# Patient Record
Sex: Male | Born: 1945 | Race: White | Hispanic: No | Marital: Married | Smoking: Never smoker
Health system: Southern US, Community
[De-identification: ages and names within clinical notes are randomized; demographics above are authoritative.]

## PROBLEM LIST (undated history)

## (undated) DIAGNOSIS — E785 Hyperlipidemia, unspecified: Secondary | ICD-10-CM

## (undated) DIAGNOSIS — G4730 Sleep apnea, unspecified: Secondary | ICD-10-CM

## (undated) DIAGNOSIS — I10 Essential (primary) hypertension: Secondary | ICD-10-CM

## (undated) DIAGNOSIS — E669 Obesity, unspecified: Secondary | ICD-10-CM

## (undated) DIAGNOSIS — E119 Type 2 diabetes mellitus without complications: Secondary | ICD-10-CM

## (undated) HISTORY — PX: KNEE ARTHROSCOPY: SUR90

---

## 2011-10-30 ENCOUNTER — Inpatient Hospital Stay (HOSPITAL_COMMUNITY)
Admission: EM | Admit: 2011-10-30 | Discharge: 2011-10-31 | DRG: 287 | Disposition: A | Discharge status: Home / Self Care | Payer: 59 | Source: Ambulatory Visit | Attending: Cardiology | Admitting: Cardiology

## 2011-10-30 ENCOUNTER — Emergency Department (HOSPITAL_COMMUNITY): Payer: 59

## 2011-10-30 ENCOUNTER — Other Ambulatory Visit: Payer: Self-pay

## 2011-10-30 ENCOUNTER — Encounter: Payer: Self-pay | Admitting: Emergency Medicine

## 2011-10-30 DIAGNOSIS — E669 Obesity, unspecified: Secondary | ICD-10-CM | POA: Diagnosis present

## 2011-10-30 DIAGNOSIS — E119 Type 2 diabetes mellitus without complications: Secondary | ICD-10-CM | POA: Diagnosis present

## 2011-10-30 DIAGNOSIS — R0789 Other chest pain: Principal | ICD-10-CM | POA: Diagnosis present

## 2011-10-30 DIAGNOSIS — E785 Hyperlipidemia, unspecified: Secondary | ICD-10-CM | POA: Diagnosis present

## 2011-10-30 DIAGNOSIS — R079 Chest pain, unspecified: Secondary | ICD-10-CM

## 2011-10-30 DIAGNOSIS — G4730 Sleep apnea, unspecified: Secondary | ICD-10-CM | POA: Diagnosis present

## 2011-10-30 HISTORY — DX: Sleep apnea, unspecified: G47.30

## 2011-10-30 HISTORY — DX: Obesity, unspecified: E66.9

## 2011-10-30 HISTORY — DX: Hyperlipidemia, unspecified: E78.5

## 2011-10-30 LAB — CARDIAC PANEL(CRET KIN+CKTOT+MB+TROPI)
CK, MB: 3.8 ng/mL (ref 0.3–4.0)
CK, MB: 3.7 ng/mL (ref 0.3–4.0)
Relative Index: 2.8 — ABNORMAL HIGH (ref 0.0–2.5)
Total CK: 133 U/L (ref 7–232)
Troponin I: <0.3 ng/mL (ref <0.30)
Troponin I: <0.3 ng/mL (ref <0.30)

## 2011-10-30 LAB — DIFFERENTIAL
Basophils Relative: 0 % (ref 0–1)
Eosinophils Absolute: 0.2 10*3/uL (ref 0.0–0.7)
Lymphs Abs: 3.2 10*3/uL (ref 0.7–4.0)
Neutro Abs: 3.1 10*3/uL (ref 1.7–7.7)
Neutrophils Relative %: 45 % (ref 43–77)

## 2011-10-30 LAB — BASIC METABOLIC PANEL
GFR calc Af Amer: >90 mL/min (ref >90)
GFR calc non Af Amer: 88 mL/min — ABNORMAL LOW (ref >90)
Potassium: 4.2 mEq/L (ref 3.5–5.1)
Sodium: 138 mEq/L (ref 135–145)

## 2011-10-30 LAB — CBC
MCH: 31.7 pg (ref 26.0–34.0)
Platelets: 214 10*3/uL (ref 150–400)
RBC: 4.48 MIL/uL (ref 4.22–5.81)

## 2011-10-30 LAB — GLUCOSE, CAPILLARY: Glucose-Capillary: 142 mg/dL — ABNORMAL HIGH (ref 70–99)

## 2011-10-30 MED ORDER — HEPARIN SOD (PORCINE) IN D5W 100 UNIT/ML IV SOLN
1850.0000 [IU]/h | INTRAVENOUS | Status: DC
  Administered 2011-10-31: 1650 [IU]/h via INTRAVENOUS
  Filled 2011-10-30 (×3): qty 250

## 2011-10-30 MED ORDER — SODIUM CHLORIDE 0.9 % IJ SOLN
3.0000 mL | Freq: Two times a day (BID) | INTRAMUSCULAR | Status: DC
  Administered 2011-10-30: 3 mL via INTRAVENOUS

## 2011-10-30 MED ORDER — ASPIRIN EC 81 MG PO TBEC
81.0000 mg | DELAYED_RELEASE_TABLET | Freq: Every day | ORAL | Status: DC
  Administered 2011-10-31: 81 mg via ORAL
  Filled 2011-10-30: qty 1

## 2011-10-30 MED ORDER — ROSUVASTATIN CALCIUM 40 MG PO TABS
40.0000 mg | ORAL_TABLET | Freq: Every day | ORAL | Status: DC
  Filled 2011-10-30 (×2): qty 1

## 2011-10-30 MED ORDER — ONDANSETRON HCL 4 MG/2ML IJ SOLN: 4.0000 mg | Freq: Four times a day (QID) | INTRAMUSCULAR | Status: DC | PRN

## 2011-10-30 MED ORDER — SODIUM CHLORIDE 0.9 % IJ SOLN: 3.0000 mL | INTRAMUSCULAR | Status: DC | PRN

## 2011-10-30 MED ORDER — DIAZEPAM 5 MG PO TABS: 5.0000 mg | ORAL_TABLET | ORAL | Status: DC

## 2011-10-30 MED ORDER — SODIUM CHLORIDE 0.9 % IV SOLN
INTRAVENOUS | Status: DC
  Administered 2011-10-31: 125 mL/h via INTRAVENOUS

## 2011-10-30 MED ORDER — CITALOPRAM HYDROBROMIDE 40 MG PO TABS
40.0000 mg | ORAL_TABLET | Freq: Every day | ORAL | Status: DC
  Administered 2011-10-31: 40 mg via ORAL
  Filled 2011-10-30 (×2): qty 1

## 2011-10-30 MED ORDER — LISINOPRIL 10 MG PO TABS
10.0000 mg | ORAL_TABLET | Freq: Every day | ORAL | Status: DC
  Administered 2011-10-31: 10 mg via ORAL
  Filled 2011-10-30 (×2): qty 1

## 2011-10-30 MED ORDER — SODIUM CHLORIDE 0.9 % IV SOLN: 250.0000 mL | INTRAVENOUS | Status: DC | PRN

## 2011-10-30 MED ORDER — INSULIN ASPART 100 UNIT/ML ~~LOC~~ SOLN
0.0000 [IU] | Freq: Three times a day (TID) | SUBCUTANEOUS | Status: DC
  Administered 2011-10-31: 2 [IU] via SUBCUTANEOUS
  Filled 2011-10-30: qty 3

## 2011-10-30 MED ORDER — HEPARIN BOLUS VIA INFUSION
4000.0000 [IU] | Freq: Once | INTRAVENOUS | Status: AC
  Administered 2011-10-31: 4000 [IU] via INTRAVENOUS
  Filled 2011-10-30: qty 4000

## 2011-10-30 MED ORDER — ACETAMINOPHEN 325 MG PO TABS: 650.0000 mg | ORAL_TABLET | ORAL | Status: DC | PRN

## 2011-10-30 MED ORDER — NITROGLYCERIN 0.4 MG SL SUBL: 0.4000 mg | SUBLINGUAL_TABLET | SUBLINGUAL | Status: DC | PRN

## 2011-10-30 MED ORDER — INSULIN GLARGINE 100 UNIT/ML ~~LOC~~ SOLN
15.0000 [IU] | Freq: Every day | SUBCUTANEOUS | Status: DC
  Administered 2011-10-30: 15 [IU] via SUBCUTANEOUS
  Filled 2011-10-30: qty 3

## 2011-10-30 NOTE — Progress Notes (Signed)
ANTICOAGULATION CONSULT NOTE - Initial Consult  Pharmacy Consult for heparin Indication: chest pain/ACS  No Known Allergies  Patient Measurements:   Adjusted Body Weight: 116 kg  Vital Signs: Temp: 98.2 F (36.8 C) (12/03 1648) Temp src: Oral (12/03 1648) BP: 104/56 mmHg (12/03 1829) Pulse Rate: 48  (12/03 1829)  Labs:  Basename 10/30/11 1820 10/30/11 1816  HGB -- 14.2  HCT -- 39.2  PLT -- 214  APTT -- --  LABPROT -- --  INR -- --  HEPARINUNFRC -- --  CREATININE -- 0.92  CKTOTAL 139 --  CKMB 3.8 --  TROPONINI <0.30 --  Medical History: Past Medical History  Diagnosis Date  . Diabetes mellitus     type 2, insulin dependent, uncontrolled  . Hyperlipidemia   . Obesity   . Sleep apnea     On CPAP    Medications:  Prescriptions prior to admission  Medication Sig Dispense Refill  . atorvastatin (LIPITOR) 80 MG tablet Take 80 mg by mouth daily.        . celecoxib (CELEBREX) 200 MG capsule Take 200 mg by mouth daily.        . citalopram (CELEXA) 40 MG tablet Take 40 mg by mouth daily.        . insulin glargine (LANTUS) 100 UNIT/ML injection Inject 15 Units into the skin at bedtime.        Marland Kitchen lisinopril (PRINIVIL,ZESTRIL) 10 MG tablet Take 10 mg by mouth daily.          Assessment: 65 yo man to start heparin for ACS.  Pt w/ hx noncompliance. His meds were 30 day supplies that were filled months ago and his pharmacy does not think he is taking them as he is supposed to.  Goal of Therapy:  Heparin level 0.3-0.7 units/ml   Plan:  Heparin 4000 units bolus, heparin drip 1650 units/hr. 6 hour heparin level. Daily HL and CBC while on heparin.  Len Childs T 10/30/2011,9:37 PM

## 2011-10-30 NOTE — ED Notes (Signed)
Checked cbg it was 42 notified RN Bonita Quin of blood sugar

## 2011-10-30 NOTE — ED Notes (Signed)
Per EMS pt was at work doing physical work/lifting began having dull left sided cp with SOB . Pt went to urgent care and was given NTG with cp and SOB subsiding. EKG unremarkable. Pain free now. Pt's father and mother both with cardiac history.

## 2011-10-30 NOTE — H&P (Signed)
History reviewed with patient, no changes to be made..  Patient exam reveals large gentleman in no acute distress.  All available labs, radiology testing, previous records reviewed.  EKG shows sinus bradycardia but no acute changes. History is concerning for new onset angina.  Risk factors of sedentary lifestyle, diabetes, high cholesterol and father having MIs in his late fifties or early sixties.  Plan will be for cardiac cath in am.    10/30/2011, 9:57 PM, Cassell Clement

## 2011-10-30 NOTE — ED Provider Notes (Signed)
History     CSN: [id number] Arrival date & time: 10/30/2011  4:36 PM   First MD Initiated Contact with Patient 10/30/11 1637      Chief Complaint  Patient presents with  . Chest Pain     HPI  65 year old male history of hypertension, insulin-dependent diabetes, hyperlipidemia presents with chest pain. The patient states that he was doing physical lifting when he began having dull left-sided chest pain. The pain is without radiation. He was seen in urgent care and given 4 baby aspirin and nitroglycerin which resolved his pain. He denies back pain, Donald pain. He denies shortness of breath, weakness. He states that when he had the chest pain which lasted for approximately 20 minutes he felt like he was a fall. Denies h/o VTE in self or family. No recent hosp/surg/immob. No h/o cancer. Denies exogenous hormone use, no leg pain or swelling. Family history of coronary artery disease his father had several MIs his first being his 72s. Patient is nonsmoker. Is currently chest pain free. Denies fever, chills, cough. No History of similar. Pain is not worse with movement. No history of stress test.     Vela Prose 10/30/2011 16:43     Per EMS pt was at work doing physical work/lifting began having dull left sided cp with SOB . Pt went to urgent care and was given NTG with cp and SOB subsiding. EKG unremarkable. Pain free now. Pt's father and mother both with cardiac history.     Past Medical History  Diagnosis Date  . Diabetes mellitus     type 2, insulin dependent, uncontrolled  . Hyperlipidemia   . Obesity   . Sleep apnea     On CPAP    No past surgical history on file.  No family history on file.  History  Substance Use Topics  . Smoking status: Not on file  . Smokeless tobacco: Not on file  . Alcohol Use:     Review of Systems  All other systems reviewed and are negative.   except as noted HPI    Allergies  Review of patient's allergies indicates no known  allergies.  Home Medications   Current Outpatient Rx  Name Route Sig Dispense Refill  . ATORVASTATIN CALCIUM 80 MG PO TABS Oral Take 80 mg by mouth daily.      . CELECOXIB 200 MG PO CAPS Oral Take 200 mg by mouth daily.      Marland Kitchen CITALOPRAM HYDROBROMIDE 40 MG PO TABS Oral Take 40 mg by mouth daily.      . INSULIN GLARGINE 100 UNIT/ML  SOLN Subcutaneous Inject 15 Units into the skin at bedtime.      Marland Kitchen LISINOPRIL 10 MG PO TABS Oral Take 10 mg by mouth daily.        BP 104/56  Pulse 48  Temp(Src) 98.2 F (36.8 C) (Oral)  Resp 17  SpO2 97%  Physical Exam  Nursing note and vitals reviewed. Constitutional: He is oriented to person, place, and time. He appears well-developed and well-nourished. No distress.  HENT:  Head: Atraumatic.  Mouth/Throat: Oropharynx is clear and moist.  Eyes: Conjunctivae are normal. Pupils are equal, round, and reactive to light.  Neck: Neck supple.  Cardiovascular: Normal rate, regular rhythm, normal heart sounds and intact distal pulses.  Exam reveals no gallop and no friction rub.   No murmur heard. Pulmonary/Chest: Effort normal. No respiratory distress. He has no wheezes. He has no rales. He exhibits no tenderness.  Abdominal: Soft.  Bowel sounds are normal. There is no tenderness. There is no rebound and no guarding.  Musculoskeletal: Normal range of motion. He exhibits no edema and no tenderness.  Neurological: He is alert and oriented to person, place, and time.  Skin: Skin is warm and dry.  Psychiatric: He has a normal mood and affect.     Date: 10/30/2011  Rate: 53  Rhythm: sinus bradycardia  QRS Axis: normal  Intervals: PR prolonged  ST/T Wave abnormalities: normal  Conduction Disutrbances:first-degree A-V block   Narrative Interpretation:   Old EKG Reviewed: unchanged   ED Course  Procedures (including critical care time)  Labs Reviewed  CBC - Abnormal; Notable for the following:    MCHC 36.2 (*)    All other components within  normal limits  BASIC METABOLIC PANEL - Abnormal; Notable for the following:    Glucose, Bld 186 (*)    GFR calc non Af Amer 88 (*)    All other components within normal limits  CARDIAC PANEL(CRET KIN+CKTOT+MB+TROPI) - Abnormal; Notable for the following:    Relative Index 2.7 (*)    All other components within normal limits  GLUCOSE, CAPILLARY - Abnormal; Notable for the following:    Glucose-Capillary 204 (*)    All other components within normal limits  DIFFERENTIAL   Dg Chest 2 View  10/30/2011  *RADIOLOGY REPORT*  Clinical Data: Chest pain.  CHEST - 2 VIEW  Comparison: None.  Findings: Normal sized heart.  Clear lungs.  Mildly tortuous aorta. Thoracic spine degenerative changes.  IMPRESSION: No acute abnormality.  Original Report Authenticated By: Darrol Angel, M.D.     1. Chest pain      MDM  Multiple rf CAD pw chest pain x 20 minutes which has resolved. EKG unremarkable. CP free at this time. Received asa and ntg pta. CXR, labs, reassess.   [facility] has seen the patient. Admitted to Dr. Earl Gala, MD 10/30/11 2044

## 2011-10-30 NOTE — H&P (Signed)
History and Physical  Patient ID: Lance Barnes MRN: [id number], SOB: 10-Jan-1946 66 y.o. Date of Encounter: 10/30/2011, 8:15 PM  Primary Physician: No primary provider on file. Primary Cardiologist: New to [place], Admitted by Dr. Patty Sermons  Chief Complaint: Chest Pain Reason for Admission: Chest pain  HPI: 65yom w/ PMHx Insulin-dependent DM, HLD, Obesity and NO known h/o CAD who presented to [facility] ED with chest pain. He was in his usual state of health while at work today around 3pm he was walking and noticed his legs felt heavy, then he felt a dull pain in his left chest and felt like he was in a daze. The pain was associated with palpitations, but NO shortness of breath, dizziness, nausea, diaphoresis or radiation of pain. He has never experienced this pain in the past.   He presented to Urgent Care where he received 4 baby ASA and 1 sublingual Nitro. After 2-75minutes he was chest pain free. The chest pain lasted ~30-45 minutes total. He was then transferred to [facility] ED where has remained chest pain free since. His EKG showed sinus bradycardia with 1st degree AV block, 53bpm, NO acute ST/T wave changes. First troponin negative and CXR without acute cardiopulmonary findings.   Past Medical History  Diagnosis Date  . Obesity   . Diabetes mellitus Type 2- Uncontrolled, insulin dependent   . Hyperlipidemia    Sleep Apnea - on CPAP      Surgical History: Bilateral knee surgery   Home Meds: Medication Sig  atorvastatin (LIPITOR) 80 MG tablet Take 80 mg by mouth daily.    celecoxib (CELEBREX) 200 MG capsule Take 200 mg by mouth daily.    citalopram (CELEXA) 40 MG tablet Take 40 mg by mouth daily.    insulin glargine (LANTUS) 100 UNIT/ML injection Inject 15 Units into the skin at bedtime.    lisinopril (PRINIVIL,ZESTRIL) 10 MG tablet Take 10 mg by mouth daily.      Allergies: No Known Allergies  History   Social History  . Marital Status: Married   Occupational History  . Works for a  [employer]   Social History Main Topics  . Smoking status: Quit 17yrs ago; 10pack year history  . Smokeless tobacco: Not on file  . Alcohol Use: Occasional beer  . Drug Use: Denies  . Sexually Active:    Family History: Father - MIs in his 61s  Review of Systems: General: negative for chills, fever, night sweats or weight changes.  Cardiovascular:  As per HPI; negative for dyspnea on exertion, edema, paroxysmal nocturnal dyspnea or shortness of breath Dermatological: negative for rash Respiratory: negative for cough or wheezing Urologic: negative for hematuria Abdominal: negative for nausea, vomiting, diarrhea, bright red blood per rectum, melena, or hematemesis Neurologic: negative for visual changes, syncope, or dizziness All other systems reviewed and are otherwise negative except as noted above.  Labs:   Lab Results  Component Value Date   WBC 7.0 10/30/2011   HGB 14.2 10/30/2011   HCT 39.2 10/30/2011   MCV 87.5 10/30/2011   PLT 214 10/30/2011    Lab 10/30/11 1816  NA 138  K 4.2  CL 103  CO2 26  BUN 12  CREATININE 0.92  CALCIUM 9.1  GLUCOSE 186*    Basename 10/30/11 1820  CKTOTAL 139  CKMB 3.8  TROPONINI <0.30    Radiology/Studies:  Dg Chest 2 View 10/30/2011    Findings: Normal sized heart.  Clear lungs.  Mildly tortuous aorta. Thoracic spine degenerative changes.  IMPRESSION: No acute abnormality.      EKG: Sinus bradycardia, 59bm, w/ 1st degree AV block, NO ST/T wave changes  Physical Exam Blood pressure 104/56, pulse 48, temperature 98.2 F (36.8 C), temperature source Oral, resp. rate 17, SpO2 97.00%. General: Obese, white male. Well developed, well nourished, in no acute distress. Head: Normocephalic, atraumatic, sclera non-icteric, nares are without discharge. Neck: Supple, Negative for carotid bruits. JVD not elevated. Lungs: Clear bilaterally to auscultation without wheezes, rales, or rhonchi. Breathing is unlabored. Heart:  Distant heart sounds; RRR with S1 S2. No murmurs, rubs, or gallops appreciated. Abdomen: Obese, Soft, non-tender, non-distended with normoactive bowel sounds. No rebound/guarding. No obvious abdominal masses. Msk:  Strength and tone appear normal for age. Extremities: No clubbing or cyanosis. No edema.  Distal pedal pulses are 2+ and equal bilaterally. Neuro: Alert and oriented X 3. Moves all extremities spontaneously. Psych:  Responds to questions appropriately with a normal affect.     ASSESSMENT AND PLAN:  65yom w/ PMHx Insulin-dependent DM, HLD, Obesity and NO known h/o CAD who presented to [facility] ED with chest pain. His initial EKG and troponin are without acute ischemic findings, but due to his significant risk factors he will be admitted for further evaluation and treatment. Given his risk factors and probability of getting poor nuclear images due to his body habitus, he will go for heart catheterization in the morning.  1. Chest pain: Plan for heart catheterization in the am. NPO after midnight. Cycle cardiac enzymes. Add ASA, Heparin drip and PRN nitro. Continue statin. Will hold off on starting beta blocker 2/2 bradycardia. 2. HLD: Lipid panel pending. Cont statin 3. Sleep apnea: Respiratory therapy to fit him for CPAP for tonight until his wife can bring his home CPAP 4. Obesity: Recommend weight loss  Signed, Rimas Gilham PA-C 10/30/2011, 8:15 PM

## 2011-10-31 ENCOUNTER — Other Ambulatory Visit: Payer: Self-pay

## 2011-10-31 ENCOUNTER — Encounter (HOSPITAL_COMMUNITY)
Admission: EM | Disposition: A | Discharge status: Home / Self Care | Payer: Self-pay | Source: Ambulatory Visit | Attending: Cardiology

## 2011-10-31 ENCOUNTER — Encounter (HOSPITAL_COMMUNITY): Payer: Self-pay | Admitting: *Deleted

## 2011-10-31 DIAGNOSIS — R079 Chest pain, unspecified: Secondary | ICD-10-CM

## 2011-10-31 HISTORY — PX: LEFT HEART CATHETERIZATION WITH CORONARY ANGIOGRAM: SHX5451

## 2011-10-31 LAB — BASIC METABOLIC PANEL
BUN: 12 mg/dL (ref 6–23)
CO2: 28 mEq/L (ref 19–32)
Chloride: 105 mEq/L (ref 96–112)
Creatinine, Ser: 0.96 mg/dL (ref 0.50–1.35)

## 2011-10-31 LAB — CBC
Hemoglobin: 14.1 g/dL (ref 13.0–17.0)
MCH: 30.9 pg (ref 26.0–34.0)
MCV: 88.8 fL (ref 78.0–100.0)
RBC: 4.56 MIL/uL (ref 4.22–5.81)

## 2011-10-31 LAB — POCT ACTIVATED CLOTTING TIME: Activated Clotting Time: 105 seconds

## 2011-10-31 LAB — GLUCOSE, CAPILLARY: Glucose-Capillary: 142 mg/dL — ABNORMAL HIGH (ref 70–99)

## 2011-10-31 LAB — LIPID PANEL
HDL: 42 mg/dL (ref >39)
Total CHOL/HDL Ratio: 3.2 RATIO

## 2011-10-31 LAB — HEPARIN LEVEL (UNFRACTIONATED): Heparin Unfractionated: 0.31 IU/mL (ref 0.30–0.70)

## 2011-10-31 LAB — CARDIAC PANEL(CRET KIN+CKTOT+MB+TROPI): Total CK: 104 U/L (ref 7–232)

## 2011-10-31 SURGERY — LEFT HEART CATHETERIZATION WITH CORONARY ANGIOGRAM
Anesthesia: LOCAL

## 2011-10-31 MED ORDER — ACETAMINOPHEN 325 MG PO TABS: 650.0000 mg | ORAL_TABLET | ORAL | Status: DC | PRN

## 2011-10-31 MED ORDER — LIDOCAINE HCL (PF) 1 % IJ SOLN
INTRAMUSCULAR | Status: AC
  Filled 2011-10-31: qty 30

## 2011-10-31 MED ORDER — FENTANYL CITRATE 0.05 MG/ML IJ SOLN
INTRAMUSCULAR | Status: AC
  Filled 2011-10-31: qty 2

## 2011-10-31 MED ORDER — SODIUM CHLORIDE 0.9 % IV SOLN: INTRAVENOUS | Status: AC

## 2011-10-31 MED ORDER — NITROGLYCERIN 0.2 MG/ML ON CALL CATH LAB
INTRAVENOUS | Status: AC
  Filled 2011-10-31: qty 1

## 2011-10-31 MED ORDER — HEPARIN (PORCINE) IN NACL 2-0.9 UNIT/ML-% IJ SOLN
INTRAMUSCULAR | Status: AC
  Filled 2011-10-31: qty 2000

## 2011-10-31 MED ORDER — MIDAZOLAM HCL 2 MG/2ML IJ SOLN
INTRAMUSCULAR | Status: AC
  Filled 2011-10-31: qty 2

## 2011-10-31 MED ORDER — ONDANSETRON HCL 4 MG/2ML IJ SOLN: 4.0000 mg | Freq: Four times a day (QID) | INTRAMUSCULAR | Status: DC | PRN

## 2011-10-31 MED ORDER — ASPIRIN 81 MG PO TBEC: 81.0000 mg | DELAYED_RELEASE_TABLET | Freq: Every day | ORAL | Status: AC

## 2011-10-31 MED ORDER — VERAPAMIL HCL 2.5 MG/ML IV SOLN
INTRAVENOUS | Status: AC
  Filled 2011-10-31: qty 2

## 2011-10-31 NOTE — Progress Notes (Signed)
Pt given discharge instruction after bedrest completed. Cath site clean and dry. Bandaid dressing applied. IV taken out. Pt verbalized understanding of instructions and verbalized understanding of following up with MD. Pt left hospital by foot with wife with belongings in hand. No complaints at this time.

## 2011-10-31 NOTE — Interval H&P Note (Signed)
History and Physical Interval Note:  10/31/2011 9:38 AM  The patient is here today for a left heart catheterization. I have personally reviewed the labs and examined the patient. The History and Physical has been reviewed and there are no changes. The risks and benefits of the procedure have been reviewed with the patient. Informed consent has been signed by the patient and is present in the chart. The patient agrees to proceed with the procedure.   Lance Barnes

## 2011-10-31 NOTE — Progress Notes (Signed)
SUBJECTIVE: NO recurrent chest pain.   BP 105/53  Pulse 53  Temp(Src) 97.9 F (36.6 C) (Oral)  Resp 16  Ht 6' 4" (1.93 m)  Wt 311 lb 1.1 oz (141.1 kg)  BMI 37.86 kg/m2  SpO2 98%  Intake/Output Summary (Last 24 hours) at 10/31/11 0916 Last data filed at 10/31/11 0738  Gross per 24 hour  Intake 567.58 ml  Output    400 ml  Net 167.58 ml    PHYSICAL EXAM General: Well developed, well nourished, in no acute distress. Alert and oriented x 3.  Psych:  Good affect, responds appropriately Neck: No JVD. No masses noted.  Lungs: Clear bilaterally with no wheezes or rhonci noted.  Heart: RRR with no murmurs noted. Abdomen: Bowel sounds are present. Soft, non-tender.  Extremities: No lower extremity edema.   LABS: Basic Metabolic Panel:  Basename 10/31/11 0551 10/30/11 1816  NA 141 138  K 3.9 4.2  CL 105 103  CO2 28 26  GLUCOSE 166* 186*  BUN 12 12  CREATININE 0.96 0.92  CALCIUM 8.8 9.1  MG -- --  PHOS -- --   CBC:  Basename 10/31/11 0551 10/30/11 1816  WBC 6.2 7.0  NEUTROABS -- 3.1  HGB 14.1 14.2  HCT 40.5 39.2  MCV 88.8 87.5  PLT 202 214   Cardiac Enzymes:  Basename 10/31/11 0551 10/30/11 2219 10/30/11 1820  CKTOTAL 104 133 139  CKMB 3.1 3.7 3.8  CKMBINDEX -- -- --  TROPONINI <0.30 <0.30 <0.30   Fasting Lipid Panel:  Basename 10/31/11 0551  CHOL 133  HDL 42  LDLCALC 76  TRIG 76  CHOLHDL 3.2  LDLDIRECT --    Current Meds:    . aspirin EC  81 mg Oral Daily  . citalopram  40 mg Oral Daily  . diazepam  5 mg Oral On Call  . heparin  4,000 Units Intravenous Once  . insulin aspart  0-15 Units Subcutaneous TID WC  . insulin glargine  15 Units Subcutaneous QHS  . lisinopril  10 mg Oral Daily  . rosuvastatin  40 mg Oral q1800  . sodium chloride  3 mL Intravenous Q12H  . sodium chloride  3 mL Intravenous Q12H  . DISCONTD: diazepam  5 mg Oral On Call     ASSESSMENT AND PLAN:  Chest pain in pt with multiple cardiac risk factors. Cardiac enzymes  negative. Plan for LHC today. Labs reviewed and ok to proceed.   MCALHANY,CHRISTOPHER  12/4/20129:16 AM  

## 2011-10-31 NOTE — H&P (View-Only) (Signed)
SUBJECTIVE: NO recurrent chest pain.   BP 105/53  Pulse 53  Temp(Src) 97.9 F (36.6 C) (Oral)  Resp 16  Ht 6\' 4"  (1.93 m)  Wt 311 lb 1.1 oz (141.1 kg)  BMI 37.86 kg/m2  SpO2 98%  Intake/Output Summary (Last 24 hours) at 10/31/11 0916 Last data filed at 10/31/11 1610  Gross per 24 hour  Intake 567.58 ml  Output    400 ml  Net 167.58 ml    PHYSICAL EXAM General: Well developed, well nourished, in no acute distress. Alert and oriented x 3.  Psych:  Good affect, responds appropriately Neck: No JVD. No masses noted.  Lungs: Clear bilaterally with no wheezes or rhonci noted.  Heart: RRR with no murmurs noted. Abdomen: Bowel sounds are present. Soft, non-tender.  Extremities: No lower extremity edema.   LABS: Basic Metabolic Panel:  Basename 10/31/11 0551 10/30/11 1816  NA 141 138  K 3.9 4.2  CL 105 103  CO2 28 26  GLUCOSE 166* 186*  BUN 12 12  CREATININE 0.96 0.92  CALCIUM 8.8 9.1  MG -- --  PHOS -- --   CBC:  Basename 10/31/11 0551 10/30/11 1816  WBC 6.2 7.0  NEUTROABS -- 3.1  HGB 14.1 14.2  HCT 40.5 39.2  MCV 88.8 87.5  PLT 202 214   Cardiac Enzymes:  Basename 10/31/11 0551 10/30/11 2219 10/30/11 1820  CKTOTAL 104 133 139  CKMB 3.1 3.7 3.8  CKMBINDEX -- -- --  TROPONINI <0.30 <0.30 <0.30   Fasting Lipid Panel:  Basename 10/31/11 0551  CHOL 133  HDL 42  LDLCALC 76  TRIG 76  CHOLHDL 3.2  LDLDIRECT --    Current Meds:    . aspirin EC  81 mg Oral Daily  . citalopram  40 mg Oral Daily  . diazepam  5 mg Oral On Call  . heparin  4,000 Units Intravenous Once  . insulin aspart  0-15 Units Subcutaneous TID WC  . insulin glargine  15 Units Subcutaneous QHS  . lisinopril  10 mg Oral Daily  . rosuvastatin  40 mg Oral q1800  . sodium chloride  3 mL Intravenous Q12H  . sodium chloride  3 mL Intravenous Q12H  . DISCONTD: diazepam  5 mg Oral On Call     ASSESSMENT AND PLAN:  Chest pain in pt with multiple cardiac risk factors. Cardiac enzymes  negative. Plan for LHC today. Labs reviewed and ok to proceed.   Lance Barnes  12/4/20129:16 AM

## 2011-10-31 NOTE — Discharge Summary (Signed)
Discharge Summary   Patient ID: Lance Barnes MRN: [id number], DOB/AGE: 63-Feb-1947 63 y.o.  Primary MD: Irene Limbo, FNP Primary Cardiologist: Verne Carrow MD  Admit date: 10/30/2011 D/C date:     10/31/2011      Primary Discharge Diagnoses:  1. Chest pain, Non-Cardiac  - negative enzymes x 3   - Cath 10/31/11 with mild non-obstructive CAD, normal LV systolic function  Secondary Discharge Diagnoses:  1. Hyperlipidemia  - LDL 76 (10/31/11) on statin 2. Diabetes Mellitus, Type 2 - Insulin dependent 3. Obesity 4. Sleep Apnea, On CPAP  Allergies No Known Allergies  Diagnostic Studies/Procedures:  Dg Chest 2 View 10/30/2011  Findings: Normal sized heart.  Clear lungs.  Mildly tortuous aorta. Thoracic spine degenerative changes.  IMPRESSION: No acute abnormality.    10/31/11 - Left Heart Catheterization, Selective Coronary Angiography, Left Ventricular Angiogram Central aortic pressure: 117/66  Left ventricular pressure: 115/7/12  Left main: No disease.  Left Anterior Descending Artery: Mild 10% plaque in the proximal LAD. No obstructive lesions noted.  Circumflex Artery: OM1 is a large caliber vessel with no disease noted. AV groove Circumflex is free of disease.  Right Coronary Artery: Large dominant vessel. No disease.  Left Ventricular Angiogram: Normal wall motion. LVEF=65%.  Impression:  1. Mild non-obstructive CAD  2. Normal LV systolic function  3. Non-cardiac chest pain  Recommendations:  Medical management with aggressive risk factor reduction.   History of Present Illness: 65yo caucasian male w/ PMHx DM Type 2, HLD, Obesity, Sleep Apnea and NO known h/o CAD who presented to [facility] ED on 10/30/11 with chest pain. He presented to Urgent Care where he received 4 baby ASA and 1 sublingual Nitro with relief of his chest pain.  Hospital Course: In the [facility] ED he was chest pain free, his EKG showed sinus bradycardia with 1st degree AV block, 53bpm, NO  acute ST/T wave changes. First troponin was negative and CXR was without acute cardiopulmonary findings. Due to his significant cardiac risk factors, he was admitted for further evaluation and treatment.   A Heparin drip was initiated, ASA added, and his home meds continued. His cardiac enzymes were cycled and remained negative. He underwent cardiac catheterization on 10/31/11 with findings noted as above. He tolerated the procedure well without complications and remained chest pain free. He was discharged in stable condition to home with recommendations of aggressive risk factor reduction, including better control of his diabetes and weight loss.   Discharge Vitals: Blood pressure 105/53, pulse 54, temperature 97.9 F (36.6 C), temperature source Oral, resp. rate 16, height 6\' 4"  (1.93 m), weight 141.1 kg (311 lb 1.1 oz), SpO2 98.00%.  Labs: Lab Results  Component Value Date   WBC 6.2 10/31/2011   HGB 14.1 10/31/2011   HCT 40.5 10/31/2011   MCV 88.8 10/31/2011   PLT 202 10/31/2011    Lab 10/31/11 0551  NA 141  K 3.9  CL 105  CO2 28  BUN 12  CREATININE 0.96  CALCIUM 8.8  GLUCOSE 166*    Basename 10/31/11 0551 10/30/11 2219 10/30/11 1820  CKTOTAL 104 133 139  CKMB 3.1 3.7 3.8  TROPONINI <0.30 <0.30 <0.30   Lab Results  Component Value Date   CHOL 133 10/31/2011   HDL 42 10/31/2011   LDLCALC 76 10/31/2011   TRIG 76 10/31/2011    Discharge Medications   Current Discharge Medication List    START taking these medications   Details  aspirin EC 81 MG EC tablet Take  1 tablet (81 mg total) by mouth daily. Qty: 30 tablet, Refills: 0      CONTINUE these medications which have NOT CHANGED   Details  atorvastatin (LIPITOR) 80 MG tablet Take 80 mg by mouth daily.      celecoxib (CELEBREX) 200 MG capsule Take 200 mg by mouth daily.      citalopram (CELEXA) 40 MG tablet Take 40 mg by mouth daily.      insulin glargine (LANTUS) 100 UNIT/ML injection Inject 15 Units into the skin at  bedtime.      lisinopril (PRINIVIL,ZESTRIL) 10 MG tablet Take 10 mg by mouth daily.          Disposition    Follow-up Information    Follow up with Verne Carrow, MD on 11/15/2011. (10:45)    Contact information:   [facility] [address] [place] [address] [phone]           Outstanding Labs/Studies: None  Duration of Discharge Encounter: Greater than 30 minutes including physician and PA time.  Signed, Draedyn Weidinger PA-C 10/31/2011, 12:57 PM

## 2011-10-31 NOTE — Discharge Summary (Signed)
I saw this pt today and placed both a rounding note and cath note in the chart. cdm

## 2011-10-31 NOTE — Progress Notes (Signed)
ANTICOAGULATION CONSULT NOTE - Initial Consult  Pharmacy Consult for heparin Indication: chest pain/ACS  No Known Allergies  Patient Measurements: Height: 6\' 4"  (193 cm) Weight: 311 lb 1.1 oz (141.1 kg) IBW/kg (Calculated) : 86.8  Adjusted Body Weight: 116 kg  Vital Signs: Temp: 98.7 F (37.1 C) (12/03 2115) Temp src: Oral (12/03 2115) BP: 130/66 mmHg (12/03 2115) Pulse Rate: 56  (12/03 2337)  Labs:  Basename 10/31/11 0551 10/30/11 2219 10/30/11 1820 10/30/11 1816  HGB 14.1 -- -- 14.2  HCT 40.5 -- -- 39.2  PLT 202 -- -- 214  APTT -- -- -- --  LABPROT -- 14.6 -- --  INR -- 1.12 -- --  HEPARINUNFRC 0.31 -- -- --  CREATININE -- -- -- 0.92  CKTOTAL -- 133 139 --  CKMB -- 3.7 3.8 --  TROPONINI -- <0.30 <0.30 --   Assessment: 65 yo man with chest pain for Heparin.   Goal of Therapy:  Heparin level 0.3-0.7 units/ml   Plan:  Will increase Heparin 1850 units/hr to keep in range, f/u after cath  Lance Barnes, Lance Barnes 10/31/2011,7:18 AM

## 2011-10-31 NOTE — Op Note (Signed)
Cardiac Catheterization Operative Report  Lance Barnes [id number] 12/4/201210:46 AM No primary provider on file.  Procedure Performed:  1. Left Heart Catheterization 2. Selective Coronary Angiography 3. Left ventricular angiogram  Operator: Verne Carrow, MD  Indication:  Chest pain                                      Procedure Details: The risks, benefits, complications, treatment options, and expected outcomes were discussed with the patient. The patient and/or family concurred with the proposed plan, giving informed consent. The patient was brought to the cath lab after IV hydration was begun and oral premedication was given. The patient was further sedated with Versed and Fentanyl. An Allens test was positive on the right wrist. The right wrist was prepped and draped in a sterile fashion. 1% lidocaine was used for local anesthesia. I made multiple attempts to gain access to the right radial artery but was unable to advance a wire.  I elected to use the right groin for access. The right groin was prepped and draped in the usual manner. Using the modified Seldinger access technique, a 5 French sheath was placed in the right femoral artery. Standard diagnostic catheters were used to perform selective coronary angiography. A pigtail catheter was used to perform a left ventricular angiogram.   There were no immediate complications. The patient was taken to the recovery area in stable condition.   Hemodynamic Findings: Central aortic pressure: 117/66 Left ventricular pressure: 115/7/12  Angiographic Findings:  Left main:  No disease.   Left Anterior Descending Artery: Mild 10% plaque in the proximal LAD. No obstructive lesions noted.   Circumflex Artery: OM1 is a large caliber vessel with no disease noted. AV groove Circumflex is free of disease.   Right Coronary Artery: Large dominant vessel. No disease.   Left Ventricular Angiogram: Normal wall motion. LVEF=65%.     Impression: 1. Mild non-obstructive CAD 2. Normal LV systolic function 3. Non-cardiac chest pain  Recommendations: Medical management with aggressive risk factor reduction. D/C home today after bedrest.        Complications:  None. The patient tolerated the procedure well.

## 2011-11-09 ENCOUNTER — Encounter: Payer: Self-pay | Admitting: Cardiovascular Disease

## 2011-11-15 ENCOUNTER — Encounter: Payer: 59 | Admitting: Cardiovascular Disease

## 2014-11-04 ENCOUNTER — Encounter (HOSPITAL_COMMUNITY): Payer: Self-pay | Admitting: Cardiovascular Disease

## 2019-11-02 ENCOUNTER — Encounter (HOSPITAL_COMMUNITY): Payer: Self-pay | Admitting: Emergency Medicine

## 2019-11-02 ENCOUNTER — Emergency Department (HOSPITAL_COMMUNITY)
Admission: EM | Admit: 2019-11-02 | Discharge: 2019-11-02 | Disposition: A | Discharge status: Home / Self Care | Payer: Medicare Other | Attending: Emergency Medicine | Admitting: Emergency Medicine

## 2019-11-02 ENCOUNTER — Emergency Department (HOSPITAL_COMMUNITY): Payer: Medicare Other

## 2019-11-02 ENCOUNTER — Other Ambulatory Visit: Payer: Self-pay

## 2019-11-02 DIAGNOSIS — Z23 Encounter for immunization: Secondary | ICD-10-CM | POA: Insufficient documentation

## 2019-11-02 DIAGNOSIS — S21211A Laceration without foreign body of right back wall of thorax without penetration into thoracic cavity, initial encounter: Secondary | ICD-10-CM | POA: Insufficient documentation

## 2019-11-02 DIAGNOSIS — Y999 Unspecified external cause status: Secondary | ICD-10-CM | POA: Insufficient documentation

## 2019-11-02 DIAGNOSIS — S20221A Contusion of right back wall of thorax, initial encounter: Secondary | ICD-10-CM | POA: Insufficient documentation

## 2019-11-02 DIAGNOSIS — M791 Myalgia, unspecified site: Secondary | ICD-10-CM | POA: Diagnosis not present

## 2019-11-02 DIAGNOSIS — Z794 Long term (current) use of insulin: Secondary | ICD-10-CM | POA: Insufficient documentation

## 2019-11-02 DIAGNOSIS — S61211A Laceration without foreign body of left index finger without damage to nail, initial encounter: Secondary | ICD-10-CM | POA: Diagnosis not present

## 2019-11-02 DIAGNOSIS — Y929 Unspecified place or not applicable: Secondary | ICD-10-CM | POA: Insufficient documentation

## 2019-11-02 DIAGNOSIS — E119 Type 2 diabetes mellitus without complications: Secondary | ICD-10-CM | POA: Diagnosis not present

## 2019-11-02 DIAGNOSIS — Z79899 Other long term (current) drug therapy: Secondary | ICD-10-CM | POA: Insufficient documentation

## 2019-11-02 DIAGNOSIS — I10 Essential (primary) hypertension: Secondary | ICD-10-CM | POA: Diagnosis not present

## 2019-11-02 DIAGNOSIS — Y9389 Activity, other specified: Secondary | ICD-10-CM | POA: Insufficient documentation

## 2019-11-02 HISTORY — DX: Type 2 diabetes mellitus without complications: E11.9

## 2019-11-02 HISTORY — DX: Essential (primary) hypertension: I10

## 2019-11-02 LAB — COMPREHENSIVE METABOLIC PANEL
ALT: 16 U/L (ref 0–44)
AST: 18 U/L (ref 15–41)
Albumin: 3.4 g/dL — ABNORMAL LOW (ref 3.5–5.0)
Alkaline Phosphatase: 56 U/L (ref 38–126)
Anion gap: 8 (ref 5–15)
BUN: 15 mg/dL (ref 8–23)
CO2: 23 mmol/L (ref 22–32)
Calcium: 8.9 mg/dL (ref 8.9–10.3)
Chloride: 106 mmol/L (ref 98–111)
Creatinine, Ser: 1.12 mg/dL (ref 0.61–1.24)
GFR calc Af Amer: >60 mL/min (ref >60)
GFR calc non Af Amer: >60 mL/min (ref >60)
Glucose, Bld: 343 mg/dL — ABNORMAL HIGH (ref 70–99)
Potassium: 4.2 mmol/L (ref 3.5–5.1)
Sodium: 137 mmol/L (ref 135–145)
Total Bilirubin: 0.7 mg/dL (ref 0.3–1.2)
Total Protein: 6.2 g/dL — ABNORMAL LOW (ref 6.5–8.1)

## 2019-11-02 LAB — I-STAT CHEM 8, ED
BUN: 17 mg/dL (ref 8–23)
Calcium, Ion: 1.13 mmol/L — ABNORMAL LOW (ref 1.15–1.40)
Chloride: 104 mmol/L (ref 98–111)
Creatinine, Ser: 1 mg/dL (ref 0.61–1.24)
Glucose, Bld: 341 mg/dL — ABNORMAL HIGH (ref 70–99)
HCT: 41 % (ref 39.0–52.0)
Hemoglobin: 13.9 g/dL (ref 13.0–17.0)
Potassium: 4.2 mmol/L (ref 3.5–5.1)
Sodium: 137 mmol/L (ref 135–145)
TCO2: 24 mmol/L (ref 22–32)

## 2019-11-02 LAB — CDS SEROLOGY

## 2019-11-02 LAB — CBC
HCT: 37.6 % — ABNORMAL LOW (ref 39.0–52.0)
Hemoglobin: 13 g/dL (ref 13.0–17.0)
MCH: 31.2 pg (ref 26.0–34.0)
MCHC: 34.6 g/dL (ref 30.0–36.0)
MCV: 90.2 fL (ref 80.0–100.0)
Platelets: 205 10*3/uL (ref 150–400)
RBC: 4.17 MIL/uL — ABNORMAL LOW (ref 4.22–5.81)
RDW: 13.2 % (ref 11.5–15.5)
WBC: 5 10*3/uL (ref 4.0–10.5)
nRBC: 0 % (ref 0.0–0.2)

## 2019-11-02 LAB — URINALYSIS, ROUTINE W REFLEX MICROSCOPIC
Bacteria, UA: NONE SEEN
Bilirubin Urine: NEGATIVE
Glucose, UA: >=500 mg/dL — AB
Ketones, ur: NEGATIVE mg/dL
Leukocytes,Ua: NEGATIVE
Nitrite: NEGATIVE
Protein, ur: NEGATIVE mg/dL
Specific Gravity, Urine: 1.04 — ABNORMAL HIGH (ref 1.005–1.030)
pH: 5 (ref 5.0–8.0)

## 2019-11-02 LAB — ETHANOL: Alcohol, Ethyl (B): <10 mg/dL (ref <10)

## 2019-11-02 LAB — LACTIC ACID, PLASMA: Lactic Acid, Venous: 2.6 mmol/L (ref 0.5–1.9)

## 2019-11-02 LAB — SAMPLE TO BLOOD BANK

## 2019-11-02 LAB — PROTIME-INR
INR: 1 (ref 0.8–1.2)
Prothrombin Time: 13.1 seconds (ref 11.4–15.2)

## 2019-11-02 MED ORDER — LIDOCAINE-EPINEPHRINE (PF) 2 %-1:200000 IJ SOLN
10.0000 mL | Freq: Once | INTRAMUSCULAR | Status: AC
  Administered 2019-11-02: 10 mL
  Filled 2019-11-02: qty 20

## 2019-11-02 MED ORDER — TETANUS-DIPHTH-ACELL PERTUSSIS 5-2.5-18.5 LF-MCG/0.5 IM SUSP
0.5000 mL | Freq: Once | INTRAMUSCULAR | Status: AC
  Administered 2019-11-02: 0.5 mL via INTRAMUSCULAR
  Filled 2019-11-02: qty 0.5

## 2019-11-02 MED ORDER — FENTANYL CITRATE (PF) 100 MCG/2ML IJ SOLN
50.0000 ug | Freq: Once | INTRAMUSCULAR | Status: AC
  Administered 2019-11-02: 50 ug via INTRAVENOUS
  Filled 2019-11-02: qty 2

## 2019-11-02 MED ORDER — CEFAZOLIN SODIUM-DEXTROSE 2-4 GM/100ML-% IV SOLN
2.0000 g | Freq: Once | INTRAVENOUS | Status: AC
  Administered 2019-11-02: 2 g via INTRAVENOUS
  Filled 2019-11-02: qty 100

## 2019-11-02 MED ORDER — CEPHALEXIN 500 MG PO CAPS: 500.0000 mg | ORAL_CAPSULE | Freq: Four times a day (QID) | ORAL | 0 refills | Status: AC

## 2019-11-02 MED ORDER — MORPHINE SULFATE (PF) 4 MG/ML IV SOLN
4.0000 mg | INTRAVENOUS | Status: DC | PRN
  Administered 2019-11-02: 4 mg via INTRAVENOUS
  Filled 2019-11-02: qty 1

## 2019-11-02 MED ORDER — IOHEXOL 300 MG/ML  SOLN
100.0000 mL | Freq: Once | INTRAMUSCULAR | Status: AC | PRN
  Administered 2019-11-02: 100 mL via INTRAVENOUS

## 2019-11-02 MED ORDER — IBUPROFEN 600 MG PO TABS: 600.0000 mg | ORAL_TABLET | Freq: Four times a day (QID) | ORAL | 0 refills | Status: AC | PRN

## 2019-11-02 NOTE — ED Notes (Signed)
Pt discharge instructions and prescriptions reviewed with patient. The patient verbalized understanding of both pt discharged.
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
## 2019-11-02 NOTE — Discharge Instructions (Addendum)
You had 7 stitches placed in your left hand and 2 stitches placed in your back.  The stitches should be removed in 10 to 14 days.  You should call to schedule a follow-up appointment either with our trauma surgeon (Dr Redmond Pulling) or our hand surgeon (Dr Mardelle Matte) in 10-14 days.  If you are unable to schedule appointment with them, you may always return to an urgent care or come back to the ER to have your stitches removed.  We will also prescribe you Keflex, and antibiotics for several days to prevent the possibility of infection happening in your hand.  If you notice redness streaking up your hand, begin having fevers or chills, pus draining from your wounds, or sudden painful swelling of your entire finger, he should come back to the ER immediately.  These may be signs or symptoms of a serious infection in your hand.

## 2019-11-02 NOTE — ED Notes (Signed)
Pt returned from CT °
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
## 2019-11-02 NOTE — ED Notes (Signed)
Daughter, Jonelle Sidle, to be called when pt is discharged or with any updates. Phone [phone].

## 2019-11-03 ENCOUNTER — Encounter (HOSPITAL_COMMUNITY): Payer: Self-pay | Admitting: Cardiovascular Disease
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
